# Patient Record
Sex: Female | Born: 1990 | Race: White | Hispanic: No | Marital: Single | Smoking: Light tobacco smoker
Health system: Southern US, Community
[De-identification: ages and names within clinical notes are randomized; demographics above are authoritative.]

## PROBLEM LIST (undated history)

## (undated) DIAGNOSIS — K5090 Crohn's disease, unspecified, without complications: Secondary | ICD-10-CM

## (undated) HISTORY — DX: Crohn's disease, unspecified, without complications: K50.90

---
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
## 2014-12-28 ENCOUNTER — Encounter: Payer: Self-pay | Admitting: Women's Health

## 2014-12-28 ENCOUNTER — Ambulatory Visit (INDEPENDENT_AMBULATORY_CARE_PROVIDER_SITE_OTHER): Payer: Self-pay | Admitting: Women's Health

## 2014-12-28 VITALS — BP 110/72 | Ht 65.0 in | Wt 174.6 lb

## 2014-12-28 DIAGNOSIS — Z3041 Encounter for surveillance of contraceptive pills: Secondary | ICD-10-CM

## 2014-12-28 MED ORDER — NORGESTIMATE-ETH ESTRADIOL 0.25-35 MG-MCG PO TABS: 1.0000 | ORAL_TABLET | Freq: Every day | ORAL | Status: AC

## 2014-12-28 NOTE — Patient Instructions (Signed)
Health Maintenance, Female Adopting a healthy lifestyle and getting preventive care can go a long way to promote health and wellness. Talk with your health care provider about what schedule of regular examinations is right for you. This is a good chance for you to check in with your provider about disease prevention and staying healthy. In between checkups, there are plenty of things you can do on your own. Experts have done a lot of research about which lifestyle changes and preventive measures are most likely to keep you healthy. Ask your health care provider for more information. WEIGHT AND DIET  Eat a healthy diet  Be sure to include plenty of vegetables, fruits, low-fat dairy products, and lean protein.  Do not eat a lot of foods high in solid fats, added sugars, or salt.  Get regular exercise. This is one of the most important things you can do for your health.  Most adults should exercise for at least 150 minutes each week. The exercise should increase your heart rate and make you sweat (moderate-intensity exercise).  Most adults should also do strengthening exercises at least twice a week. This is in addition to the moderate-intensity exercise.  Maintain a healthy weight  Body mass index (BMI) is a measurement that can be used to identify possible weight problems. It estimates body fat based on height and weight. Your health care provider can help determine your BMI and help you achieve or maintain a healthy weight.  For females 20 years of age and older:   A BMI below 18.5 is considered underweight.  A BMI of 18.5 to 24.9 is normal.  A BMI of 25 to 29.9 is considered overweight.  A BMI of 30 and above is considered obese.  Watch levels of cholesterol and blood lipids  You should start having your blood tested for lipids and cholesterol at 24 years of age, then have this test every 5 years.  You may need to have your cholesterol levels checked more often if:  Your lipid  or cholesterol levels are high.  You are older than 24 years of age.  You are at high risk for heart disease.  CANCER SCREENING   Lung Cancer  Lung cancer screening is recommended for adults 55-80 years old who are at high risk for lung cancer because of a history of smoking.  A yearly low-dose CT scan of the lungs is recommended for people who:  Currently smoke.  Have quit within the past 15 years.  Have at least a 30-pack-year history of smoking. A pack year is smoking an average of one pack of cigarettes a day for 1 year.  Yearly screening should continue until it has been 15 years since you quit.  Yearly screening should stop if you develop a health problem that would prevent you from having lung cancer treatment.  Breast Cancer  Practice breast self-awareness. This means understanding how your breasts normally appear and feel.  It also means doing regular breast self-exams. Let your health care provider know about any changes, no matter how small.  If you are in your 20s or 30s, you should have a clinical breast exam (CBE) by a health care provider every 1-3 years as part of a regular health exam.  If you are 40 or older, have a CBE every year. Also consider having a breast X-ray (mammogram) every year.  If you have a family history of breast cancer, talk to your health care provider about genetic screening.  If you   are at high risk for breast cancer, talk to your health care provider about having an MRI and a mammogram every year.  Breast cancer gene (BRCA) assessment is recommended for women who have family members with BRCA-related cancers. BRCA-related cancers include:  Breast.  Ovarian.  Tubal.  Peritoneal cancers.  Results of the assessment will determine the need for genetic counseling and BRCA1 and BRCA2 testing. Cervical Cancer Your health care provider may recommend that you be screened regularly for cancer of the pelvic organs (ovaries, uterus, and  vagina). This screening involves a pelvic examination, including checking for microscopic changes to the surface of your cervix (Pap test). You may be encouraged to have this screening done every 3 years, beginning at age 21.  For women ages 30-65, health care providers may recommend pelvic exams and Pap testing every 3 years, or they may recommend the Pap and pelvic exam, combined with testing for human papilloma virus (HPV), every 5 years. Some types of HPV increase your risk of cervical cancer. Testing for HPV may also be done on women of any age with unclear Pap test results.  Other health care providers may not recommend any screening for nonpregnant women who are considered low risk for pelvic cancer and who do not have symptoms. Ask your health care provider if a screening pelvic exam is right for you.  If you have had past treatment for cervical cancer or a condition that could lead to cancer, you need Pap tests and screening for cancer for at least 20 years after your treatment. If Pap tests have been discontinued, your risk factors (such as having a new sexual partner) need to be reassessed to determine if screening should resume. Some women have medical problems that increase the chance of getting cervical cancer. In these cases, your health care provider may recommend more frequent screening and Pap tests. Colorectal Cancer  This type of cancer can be detected and often prevented.  Routine colorectal cancer screening usually begins at 24 years of age and continues through 24 years of age.  Your health care provider may recommend screening at an earlier age if you have risk factors for colon cancer.  Your health care provider may also recommend using home test kits to check for hidden blood in the stool.  A small camera at the end of a tube can be used to examine your colon directly (sigmoidoscopy or colonoscopy). This is done to check for the earliest forms of colorectal  cancer.  Routine screening usually begins at age 50.  Direct examination of the colon should be repeated every 5-10 years through 24 years of age. However, you may need to be screened more often if early forms of precancerous polyps or small growths are found. Skin Cancer  Check your skin from head to toe regularly.  Tell your health care provider about any new moles or changes in moles, especially if there is a change in a mole's shape or color.  Also tell your health care provider if you have a mole that is larger than the size of a pencil eraser.  Always use sunscreen. Apply sunscreen liberally and repeatedly throughout the day.  Protect yourself by wearing long sleeves, pants, a wide-brimmed hat, and sunglasses whenever you are outside. HEART DISEASE, DIABETES, AND HIGH BLOOD PRESSURE   High blood pressure causes heart disease and increases the risk of stroke. High blood pressure is more likely to develop in:  People who have blood pressure in the high end   of the normal range (130-139/85-89 mm Hg).  People who are overweight or obese.  People who are African American.  If you are 38-23 years of age, have your blood pressure checked every 3-5 years. If you are 61 years of age or older, have your blood pressure checked every year. You should have your blood pressure measured twice--once when you are at a hospital or clinic, and once when you are not at a hospital or clinic. Record the average of the two measurements. To check your blood pressure when you are not at a hospital or clinic, you can use:  An automated blood pressure machine at a pharmacy.  A home blood pressure monitor.  If you are between 45 years and 39 years old, ask your health care provider if you should take aspirin to prevent strokes.  Have regular diabetes screenings. This involves taking a blood sample to check your fasting blood sugar level.  If you are at a normal weight and have a low risk for diabetes,  have this test once every three years after 24 years of age.  If you are overweight and have a high risk for diabetes, consider being tested at a younger age or more often. PREVENTING INFECTION  Hepatitis B  If you have a higher risk for hepatitis B, you should be screened for this virus. You are considered at high risk for hepatitis B if:  You were born in a country where hepatitis B is common. Ask your health care provider which countries are considered high risk.  Your parents were born in a high-risk country, and you have not been immunized against hepatitis B (hepatitis B vaccine).  You have HIV or AIDS.  You use needles to inject street drugs.  You live with someone who has hepatitis B.  You have had sex with someone who has hepatitis B.  You get hemodialysis treatment.  You take certain medicines for conditions, including cancer, organ transplantation, and autoimmune conditions. Hepatitis C  Blood testing is recommended for:  Everyone born from [birth year] through 1965.  Anyone with known risk factors for hepatitis C. Sexually transmitted infections (STIs)  You should be screened for sexually transmitted infections (STIs) including gonorrhea and chlamydia if:  You are sexually active and are younger than 24 years of age.  You are older than 24 years of age and your health care provider tells you that you are at risk for this type of infection.  Your sexual activity has changed since you were last screened and you are at an increased risk for chlamydia or gonorrhea. Ask your health care provider if you are at risk.  If you do not have HIV, but are at risk, it may be recommended that you take a prescription medicine daily to prevent HIV infection. This is called pre-exposure prophylaxis (PrEP). You are considered at risk if:  You are sexually active and do not regularly use condoms or know the HIV status of your partner(s).  You take drugs by injection.  You are sexually  active with a partner who has HIV. Talk with your health care provider about whether you are at high risk of being infected with HIV. If you choose to begin PrEP, you should first be tested for HIV. You should then be tested every 3 months for as long as you are taking PrEP.  PREGNANCY   If you are premenopausal and you may become pregnant, ask your health care provider about preconception counseling.  If you may  become pregnant, take 400 to 800 micrograms (mcg) of folic acid every day.  If you want to prevent pregnancy, talk to your health care provider about birth control (contraception). OSTEOPOROSIS AND MENOPAUSE   Osteoporosis is a disease in which the bones lose minerals and strength with aging. This can result in serious bone fractures. Your risk for osteoporosis can be identified using a bone density scan.  If you are 15 years of age or older, or if you are at risk for osteoporosis and fractures, ask your health care provider if you should be screened.  Ask your health care provider whether you should take a calcium or vitamin D supplement to lower your risk for osteoporosis.  Menopause may have certain physical symptoms and risks.  Hormone replacement therapy may reduce some of these symptoms and risks. Talk to your health care provider about whether hormone replacement therapy is right for you.  HOME CARE INSTRUCTIONS   Schedule regular health, dental, and eye exams.  Stay current with your immunizations.   Do not use any tobacco products including cigarettes, chewing tobacco, or electronic cigarettes.  If you are pregnant, do not drink alcohol.  If you are breastfeeding, limit how much and how often you drink alcohol.  Limit alcohol intake to no more than 1 drink per day for nonpregnant women. One drink equals 12 ounces of beer, 5 ounces of wine, or 1 ounces of hard liquor.  Do not use street drugs.  Do not share needles.  Ask your health care provider for help if  you need support or information about quitting drugs.  Tell your health care provider if you often feel depressed.  Tell your health care provider if you have ever been abused or do not feel safe at home.   This information is not intended to replace advice given to you by your health care provider. Make sure you discuss any questions you have with your health care provider.   Document Released: 07/31/2010 Document Revised: 02/05/2014 Document Reviewed: 12/17/2012 Elsevier Interactive Patient Education [employer].

## 2014-12-28 NOTE — Progress Notes (Signed)
Kathryn Hudson 04/30/1990 [id number]    History:    Presents for new patient who recently moved here need a refill of OCs. Problem with insurance today reports new insurance starts January 1. Requests minimum. Same partner for 5 years/denies need for STD screen. Gardasil series completed. Reports normal Pap history, Pap normal 2015. Missed 1 cycle one month ago and had heavy bleeding with last cycle. Missed AB 1 year ago had  D&E, normal cycles after on Loestrin.  Past medical history, past surgical history, family history and social history were all reviewed and documented in the EPIC chart. [employer] of [employer]. 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  Boyfriend DJ Clementeen GrahamCurry going to play for [employer]. Father died of aneurysm,  ROS:  A ROS was performed and pertinent positives and negatives are included.  Exam:  Filed Vitals:   12/28/14 1433  BP: 110/72    General appearance:  Normal Thyroid:  Symmetrical, normal in size, without palpable masses or nodularity. Respiratory  Auscultation:  Clear without wheezing or rhonchi Cardiovascular  Auscultation:  Regular rate, without rubs, murmurs or gallops  Edema/varicosities:  Not grossly evident Abdominal  Soft,nontender, without masses, guarding or rebound.  Liver/spleen:  No organomegaly noted  Hernia:  None appreciated  Skin  Inspection:  Grossly normal   Breasts: Examined lying and sitting.     Right: Without masses, retractions, discharge or axillary adenopathy.     Left: Without masses, retractions, discharge or axillary adenopathy. Gentitourinary   Inguinal/mons:  Normal without inguinal adenopathy  External genitalia:  Normal  BUS/Urethra/Skene's glands:  Normal  Vagina:  Normal  Cervix:  Normal  Uterus:  normal in size, shape and contour.  Midline and mobile  Adnexa/parametria:     Rt: Without masses or tenderness.   Lt: Without masses or tenderness.  Anus and  perineum: Normal   Assessment/Plan:  24 y.o. S WF G1 P0  for annual exam with no complaints.  Irregular cycle 1 Contraception management  Plan: Contraception options reviewed will try Sprintec prescription, proper use given and reviewed start with next cycle. Continue condoms especially first month and for infection control. SBE's, exercise, calcium rich diet, MVI daily encouraged. If cycles do not regulate instructed to call or return to office for further testing/labs.    Harrington ChallengerYOUNG,NANCY J [facility] LLCWHNP, 5:05 PM 12/28/2014

## 2015-01-06 ENCOUNTER — Ambulatory Visit: Payer: Self-pay | Admitting: Women's Health
